# Patient Record
Sex: Female | Born: 1995
Health system: Southern US, Community
[De-identification: ages and names within clinical notes are randomized; demographics above are authoritative.]

## PROBLEM LIST (undated history)

## (undated) DIAGNOSIS — H9192 Unspecified hearing loss, left ear: Secondary | ICD-10-CM

## (undated) DIAGNOSIS — K5010 Crohn's disease of large intestine without complications: Secondary | ICD-10-CM

## (undated) DIAGNOSIS — J45909 Unspecified asthma, uncomplicated: Secondary | ICD-10-CM

---

## 2017-07-02 ENCOUNTER — Encounter (HOSPITAL_COMMUNITY): Payer: Self-pay | Admitting: *Deleted

## 2017-07-02 ENCOUNTER — Inpatient Hospital Stay (HOSPITAL_COMMUNITY)
Admission: EM | Admit: 2017-07-02 | Discharge: 2017-07-04 | DRG: 387 | Disposition: A | Discharge status: Home / Self Care | Payer: Self-pay | Attending: Internal Medicine | Admitting: Internal Medicine

## 2017-07-02 ENCOUNTER — Emergency Department (HOSPITAL_COMMUNITY): Payer: Self-pay

## 2017-07-02 DIAGNOSIS — K50811 Crohn's disease of both small and large intestine with rectal bleeding: Principal | ICD-10-CM | POA: Diagnosis present

## 2017-07-02 DIAGNOSIS — K5010 Crohn's disease of large intestine without complications: Secondary | ICD-10-CM

## 2017-07-02 DIAGNOSIS — J45909 Unspecified asthma, uncomplicated: Secondary | ICD-10-CM | POA: Diagnosis present

## 2017-07-02 DIAGNOSIS — F17210 Nicotine dependence, cigarettes, uncomplicated: Secondary | ICD-10-CM | POA: Diagnosis present

## 2017-07-02 DIAGNOSIS — K529 Noninfective gastroenteritis and colitis, unspecified: Secondary | ICD-10-CM

## 2017-07-02 DIAGNOSIS — D649 Anemia, unspecified: Secondary | ICD-10-CM

## 2017-07-02 DIAGNOSIS — K50911 Crohn's disease, unspecified, with rectal bleeding: Secondary | ICD-10-CM

## 2017-07-02 DIAGNOSIS — K5090 Crohn's disease, unspecified, without complications: Secondary | ICD-10-CM | POA: Diagnosis present

## 2017-07-02 DIAGNOSIS — K50011 Crohn's disease of small intestine with rectal bleeding: Secondary | ICD-10-CM

## 2017-07-02 DIAGNOSIS — D638 Anemia in other chronic diseases classified elsewhere: Secondary | ICD-10-CM | POA: Diagnosis present

## 2017-07-02 DIAGNOSIS — E876 Hypokalemia: Secondary | ICD-10-CM | POA: Diagnosis present

## 2017-07-02 HISTORY — DX: Unspecified asthma, uncomplicated: J45.909

## 2017-07-02 HISTORY — DX: Crohn's disease of large intestine without complications: K50.10

## 2017-07-02 HISTORY — DX: Unspecified hearing loss, left ear: H91.92

## 2017-07-02 LAB — CBC
HCT: 34.3 % — ABNORMAL LOW (ref 36.0–46.0)
Hemoglobin: 10.9 g/dL — ABNORMAL LOW (ref 12.0–15.0)
MCH: 27.5 pg (ref 26.0–34.0)
MCHC: 31.8 g/dL (ref 30.0–36.0)
MCV: 86.6 fL (ref 78.0–100.0)
PLATELETS: 414 10*3/uL — AB (ref 150–400)
RBC: 3.96 MIL/uL (ref 3.87–5.11)
RDW: 13.4 % (ref 11.5–15.5)
WBC: 12.4 10*3/uL — ABNORMAL HIGH (ref 4.0–10.5)

## 2017-07-02 LAB — COMPREHENSIVE METABOLIC PANEL
ALBUMIN: 3.4 g/dL — AB (ref 3.5–5.0)
ALT: 8 U/L — AB (ref 14–54)
AST: 14 U/L — AB (ref 15–41)
Alkaline Phosphatase: 61 U/L (ref 38–126)
Anion gap: 8 (ref 5–15)
BUN: 6 mg/dL (ref 6–20)
CALCIUM: 9 mg/dL (ref 8.9–10.3)
CHLORIDE: 107 mmol/L (ref 101–111)
CO2: 23 mmol/L (ref 22–32)
CREATININE: 0.74 mg/dL (ref 0.44–1.00)
GFR calc non Af Amer: >60 mL/min (ref >60)
GLUCOSE: 81 mg/dL (ref 65–99)
Potassium: 3.3 mmol/L — ABNORMAL LOW (ref 3.5–5.1)
Sodium: 138 mmol/L (ref 135–145)
Total Bilirubin: 0.3 mg/dL (ref 0.3–1.2)
Total Protein: 7 g/dL (ref 6.5–8.1)

## 2017-07-02 LAB — DIFFERENTIAL
BASOS ABS: 0 10*3/uL (ref 0.0–0.1)
BASOS PCT: 0 %
EOS ABS: 0.5 10*3/uL (ref 0.0–0.7)
Eosinophils Relative: 4 %
LYMPHS ABS: 2.5 10*3/uL (ref 0.7–4.0)
Lymphocytes Relative: 20 %
MONO ABS: 1.1 10*3/uL — AB (ref 0.1–1.0)
MONOS PCT: 9 %
Neutro Abs: 8.3 10*3/uL — ABNORMAL HIGH (ref 1.7–7.7)
Neutrophils Relative %: 67 %

## 2017-07-02 LAB — I-STAT BETA HCG BLOOD, ED (MC, WL, AP ONLY)

## 2017-07-02 LAB — LIPASE, BLOOD: LIPASE: 29 U/L (ref 11–51)

## 2017-07-02 MED ORDER — ALBUTEROL SULFATE HFA 108 (90 BASE) MCG/ACT IN AERS: 2.0000 | INHALATION_SPRAY | Freq: Four times a day (QID) | RESPIRATORY_TRACT | Status: DC | PRN

## 2017-07-02 MED ORDER — ALBUTEROL SULFATE (2.5 MG/3ML) 0.083% IN NEBU: 2.5000 mg | INHALATION_SOLUTION | Freq: Four times a day (QID) | RESPIRATORY_TRACT | Status: DC | PRN

## 2017-07-02 MED ORDER — ONDANSETRON HCL 4 MG PO TABS: 4.0000 mg | ORAL_TABLET | Freq: Four times a day (QID) | ORAL | Status: DC | PRN

## 2017-07-02 MED ORDER — ACETAMINOPHEN 325 MG PO TABS: 650.0000 mg | ORAL_TABLET | Freq: Four times a day (QID) | ORAL | Status: DC | PRN

## 2017-07-02 MED ORDER — METHYLPREDNISOLONE SODIUM SUCC 125 MG IJ SOLR
60.0000 mg | Freq: Two times a day (BID) | INTRAMUSCULAR | Status: DC
  Administered 2017-07-03 – 2017-07-04 (×3): 60 mg via INTRAVENOUS
  Filled 2017-07-02 (×3): qty 2

## 2017-07-02 MED ORDER — NICOTINE 21 MG/24HR TD PT24
21.0000 mg | MEDICATED_PATCH | Freq: Once | TRANSDERMAL | Status: DC
  Administered 2017-07-02: 21 mg via TRANSDERMAL
  Filled 2017-07-02: qty 1

## 2017-07-02 MED ORDER — IOPAMIDOL (ISOVUE-300) INJECTION 61%
INTRAVENOUS | Status: AC
  Filled 2017-07-02: qty 30

## 2017-07-02 MED ORDER — MORPHINE SULFATE (PF) 4 MG/ML IV SOLN
4.0000 mg | Freq: Once | INTRAVENOUS | Status: AC
  Administered 2017-07-02: 4 mg via INTRAVENOUS
  Filled 2017-07-02: qty 1

## 2017-07-02 MED ORDER — METHYLPREDNISOLONE SODIUM SUCC 125 MG IJ SOLR
125.0000 mg | Freq: Once | INTRAMUSCULAR | Status: AC
  Administered 2017-07-02: 125 mg via INTRAVENOUS
  Filled 2017-07-02: qty 2

## 2017-07-02 MED ORDER — POTASSIUM CHLORIDE IN NACL 20-0.9 MEQ/L-% IV SOLN
INTRAVENOUS | Status: DC
  Filled 2017-07-02: qty 1000

## 2017-07-02 MED ORDER — MORPHINE SULFATE (PF) 2 MG/ML IV SOLN
2.0000 mg | INTRAVENOUS | Status: DC | PRN
  Administered 2017-07-02 – 2017-07-03 (×5): 2 mg via INTRAVENOUS
  Filled 2017-07-02 (×5): qty 1

## 2017-07-02 MED ORDER — SODIUM CHLORIDE 0.9 % IV BOLUS (SEPSIS)
1000.0000 mL | Freq: Once | INTRAVENOUS | Status: AC
  Administered 2017-07-02: 1000 mL via INTRAVENOUS

## 2017-07-02 MED ORDER — ONDANSETRON HCL 4 MG/2ML IJ SOLN
4.0000 mg | Freq: Once | INTRAMUSCULAR | Status: AC
Start: 2017-07-02 — End: 2017-07-02
  Administered 2017-07-02: 4 mg via INTRAVENOUS
  Filled 2017-07-02: qty 2

## 2017-07-02 MED ORDER — ONDANSETRON HCL 4 MG/2ML IJ SOLN: 4.0000 mg | Freq: Four times a day (QID) | INTRAMUSCULAR | Status: DC | PRN

## 2017-07-02 MED ORDER — POTASSIUM CHLORIDE 2 MEQ/ML IV SOLN
INTRAVENOUS | Status: DC
  Administered 2017-07-02: 21:00:00 via INTRAVENOUS
  Filled 2017-07-02 (×3): qty 1000

## 2017-07-02 MED ORDER — IOPAMIDOL (ISOVUE-300) INJECTION 61%
INTRAVENOUS | Status: AC
  Administered 2017-07-02: 100 mL
  Filled 2017-07-02: qty 100

## 2017-07-02 NOTE — ED Provider Notes (Signed)
[facility]-EMERGENCY DEPT Provider Note   CSN: [id number] Arrival date & time: 07/02/17  9562     History   Chief Complaint Chief Complaint  Patient presents with  . Abdominal Pain  . Diarrhea    HPI Stacy Wiggins is a 21 y.o. female.  HPI  21 year old female Who presents with abdominal pain. History of Crohn's colitis and no prior history of abdominal pain. Says that she has been lost to GI follow-up for about a year now. Reports 2 months of gradually worsening lower abdominal pain with bloody diarrhea. No fevers or chills but having night sweats. Has had some weight loss due to decreased appetite and limited ability to tolerate by mouth intake. No dysuria, urinary frequency, abnormal vaginal bleeding or vaginal discharge. Currently is on her menses.  Past Medical History:  Diagnosis Date  . Asthma   . Crohn's colitis (HCC)   . Hearing impaired person, left     There are no active problems to display for this patient.   History reviewed. No pertinent surgical history.  OB History    No data available       Home Medications    Prior to Admission medications   Medication Sig Start Date End Date Taking? Authorizing Provider  acetaminophen (TYLENOL) 500 MG tablet Take 1,000 mg by mouth every 6 (six) hours as needed for mild pain.   Yes [provider]  diphenhydramine-acetaminophen (TYLENOL PM) 25-500 MG TABS tablet Take 1 tablet by mouth at bedtime as needed (pain or sleep).   Yes [provider]    Family History No family history on file.  Social History Social History  Substance Use Topics  . Smoking status: Current Every Day Smoker    Packs/day: 0.50    Types: Cigarettes  . Smokeless tobacco: Never Used  . Alcohol use No     Allergies   Patient has no known allergies.   Review of Systems Review of Systems  Constitutional: Negative for fever.  Gastrointestinal: Positive for abdominal pain, blood in stool and diarrhea.  Genitourinary:  Negative for dysuria.  Hematological: Does not bruise/bleed easily.  Psychiatric/Behavioral: Negative for confusion.  All other systems reviewed and are negative.    Physical Exam Updated Vital Signs BP 102/63   Pulse 79   Temp 97.9 F (36.6 C) (Oral)   Resp 16   Ht 5\' 3"  (1.6 m)   Wt 46.7 kg (103 lb)   LMP 06/30/2017   SpO2 100%   BMI 18.25 kg/m   Physical Exam Physical Exam  Nursing note and vitals reviewed. Constitutional: Well developed, well nourished, non-toxic, and in no acute distress Head: Normocephalic and atraumatic.  Mouth/Throat: Oropharynx is clear and moist.  Neck: Normal range of motion. Neck supple.  Cardiovascular: Normal rate and regular rhythm.   Pulmonary/Chest: Effort normal and breath sounds normal.  Abdominal: Soft. There is bilateral low abdominal tenderness. There is no rebound and no guarding.  Musculoskeletal: Normal range of motion.  Neurological: Alert, no facial droop, fluent speech, moves all extremities symmetrically Skin: Skin is warm and dry.  Psychiatric: Cooperative   ED Treatments / Results  Labs (all labs ordered are listed, but only abnormal results are displayed) Labs Reviewed  COMPREHENSIVE METABOLIC PANEL - Abnormal; Notable for the following:       Result Value   Potassium 3.3 (*)    Albumin 3.4 (*)    AST 14 (*)    ALT 8 (*)    All other components  within normal limits  CBC - Abnormal; Notable for the following:    WBC 12.4 (*)    Hemoglobin 10.9 (*)    HCT 34.3 (*)    Platelets 414 (*)    All other components within normal limits  DIFFERENTIAL - Abnormal; Notable for the following:    Neutro Abs 8.3 (*)    Monocytes Absolute 1.1 (*)    All other components within normal limits  LIPASE, BLOOD  I-STAT BETA HCG BLOOD, ED ([facility], [facility], [facility] ONLY)    EKG  EKG Interpretation None       Radiology Ct Abdomen Pelvis W Contrast  Result Date: 07/02/2017 CLINICAL DATA:  Lower abdominal pain.  History of Crohn's  disease. EXAM: CT ABDOMEN AND PELVIS WITH CONTRAST TECHNIQUE: Multidetector CT imaging of the abdomen and pelvis was performed using the standard protocol following bolus administration of intravenous contrast. CONTRAST:  ISOVUE-300 IOPAMIDOL (ISOVUE-300) INJECTION 61% COMPARISON:  None. FINDINGS: Lower chest: The lung bases are clear of acute process. No pleural effusion or pulmonary lesions. The heart is normal in size. No pericardial effusion. The distal esophagus and aorta are unremarkable. Hepatobiliary: No focal hepatic lesions or intrahepatic biliary dilatation. The gallbladder is normal. No common bile duct dilatation. Pancreas: No mass, inflammation or ductal dilatation. Spleen: Normal size.  No focal lesions. Adrenals/Urinary Tract: Adrenal glands, kidneys and bladder are unremarkable. Stomach/Bowel: Diffuse colitis with moderate wall thickening and pericolonic interstitial/inflammatory changes. There is also involvement of the terminal ileum. The appendix also appears mildly inflamed but I do not see any findings of obstructive appendicitis. No obstructive findings. Vascular/Lymphatic: The aorta is normal in caliber. No dissection. The branch vessels are patent. The major venous structures are patent. No mesenteric or retroperitoneal mass or adenopathy. Scattered inflamed/ hyperplastic lymph nodes mainly in the pericecal region. Reproductive: The uterus and ovaries are unremarkable. Other: No pelvic mass or adenopathy. No free pelvic fluid collections. No inguinal mass or adenopathy. No abdominal wall hernia or subcutaneous lesions. Musculoskeletal: No significant bony findings. IMPRESSION: 1. Terminal ileitis and diffuse colitis suggesting active Crohn's disease. There is also mild diffuse inflammation of the appendix. No obstructive findings. Associated enlarged/inflamed/hyperplastic lymph nodes. 2. No other significant abdominal/pelvic findings. Electronically Signed   By: Rudie Meyer M.D.    On: 07/02/2017 15:10    Procedures Procedures (including critical care time)  Medications Ordered in ED Medications  iopamidol (ISOVUE-300) 61 % injection (not administered)  nicotine (NICODERM CQ - dosed in mg/24 hours) patch 21 mg (21 mg Transdermal Patch Applied 07/02/17 1143)  sodium chloride 0.9 % bolus 1,000 mL (0 mLs Intravenous Stopped 07/02/17 1331)  ondansetron (ZOFRAN) injection 4 mg (4 mg Intravenous Given 07/02/17 1127)  morphine 4 MG/ML injection 4 mg (4 mg Intravenous Given 07/02/17 1128)  iopamidol (ISOVUE-300) 61 % injection (100 mLs  Contrast Given 07/02/17 1432)  morphine 4 MG/ML injection 4 mg (4 mg Intravenous Given 07/02/17 1510)  methylPREDNISolone sodium succinate (SOLU-MEDROL) 125 mg/2 mL injection 125 mg (125 mg Intravenous Given 07/02/17 1537)     Initial Impression / Assessment and Plan / ED Course  I have reviewed the triage vital signs and the nursing notes.  Pertinent labs & imaging results that were available during my care of the patient were reviewed by me and considered in my medical decision making (see chart for details).     21 year old female with history of Crohn's colitis who presents with gradually worsening abdominal pain over the past 2 months. Records reviewed. Patient  last seen by gastroenterologist a year ago. Previously on budesonide, but currently not taking.   Diffusely tender abdomen. CT abd/pelvis visualized and shows pancolitis with terminal ileitis. Started on solumedrol. Concerned as patient without any outpatient follow-up and without insurance. Plan to admit for crohn's colitis.   Plan to consult GI. Discussed with Dr. Cheryl Flash, who will see patient tomorrow in consultation.   Discussed with Dr. Sharl Ma who will admit to hospitalist service.  Final Clinical Impressions(s) / ED Diagnoses   Final diagnoses:  Crohn's disease of colon without complication (HCC)  Colitis    New Prescriptions New Prescriptions   No medications on file      Lavera Guise, MD 07/02/17 [phone]

## 2017-07-02 NOTE — Progress Notes (Signed)
Received report on pt.

## 2017-07-02 NOTE — ED Notes (Signed)
Pt given oral contrast and verbalized understanding of drink times.

## 2017-07-02 NOTE — H&P (Addendum)
[facility] H&P    Patient Demographics:    Stacy Wiggins, is a 21 y.o. female  MRN: [id number]  DOB - [birth year]  Admit Date - 07/02/2017  Referring MD/NP/PA: Dr Verdie Mosher  Outpatient Primary MD for the patient is Patient, No Pcp Per  Patient coming from: Home  Chief Complaint  Patient presents with  . Abdominal Pain  . Diarrhea      HPI:    Stacy Wiggins  is a 21 y.o. female, With history of Crohn disease, asthma came to [facility] pain, diarrhea for past 2 weeks. Patient says that 2 months ago she started having gradually worsening abdominal pain and also having bloody diarrhea which became worse over the past few weeks. Pain relieved by heating pad, hot  water baths. Pain aggravated by eating food. She also continues with nausea and intermittent vomiting. Comparison night sweats. No fever. No headache. No dysuria. No chest shortness of breath. Patient has history of Crohn disease and was followed by gastroenterologist at [facility]. She stopped seeing the gastroenterologist a year ago, and has no medications for Crohn disease. Patient has no health insurance.  ED physician called and discussed with GI Dr Bosie Clos, will see patient in a.m.  Patient received Solu-Medrol 125 mg IV in the ED.     Review of systems:     All other systems reviewed and are negative.   With Past History of the following :    Past Medical History:  Diagnosis Date  . Asthma   . Crohn's colitis (HCC)   . Hearing impaired person, left          Social History:      Social History  Substance Use Topics  . Smoking status: Current Every Day Smoker    Packs/day: 0.50    Types: Cigarettes  . Smokeless tobacco: Never Used  . Alcohol use No       Family History :   No pertinent family history   Home Medications:   Prior to Admission medications   Medication Sig Start Date End Date Taking? Authorizing Provider    acetaminophen (TYLENOL) 500 MG tablet Take 1,000 mg by mouth every 6 (six) hours as needed for mild pain.   Yes [provider]  diphenhydramine-acetaminophen (TYLENOL PM) 25-500 MG TABS tablet Take 1 tablet by mouth at bedtime as needed (pain or sleep).   Yes [provider]     Allergies:    No Known Allergies   Physical Exam:   Vitals  Blood pressure 102/63, pulse 79, temperature 97.9 F (36.6 C), temperature source Oral, resp. rate 16, height 5\' 3"  (1.6 m), weight 46.7 kg (103 lb), last menstrual period 06/30/2017, SpO2 100 %.  1.  General: Appears in no acute distress  2. Psychiatric:  Intact judgement and  insight, awake alert, oriented x 3.  3. Neurologic: No focal neurological deficits, all cranial nerves intact.Strength 5/5 all 4 extremities, sensation intact all 4 extremities, plantars down going.  4. Eyes :  anicteric sclerae, moist conjunctivae with no lid lag.  PERRLA.  5. ENMT:  Oropharynx clear with moist mucous membranes and good dentition  6. Neck:  supple, no cervical lymphadenopathy appriciated, No thyromegaly  7. Respiratory : Normal respiratory effort, good air movement bilaterally,clear to  auscultation bilaterally  8. Cardiovascular : RRR, no gallops, rubs or murmurs, no leg edema  9. Gastrointestinal:  Positive bowel sounds, abdomen soft, tender to palpation in epigastric region, no hepatosplenomegaly, no rigidity or guarding       10. Skin:  No cyanosis, normal texture and turgor, no rash, lesions or ulcers  11.Musculoskeletal:  Good muscle tone,  joints appear normal , no effusions,  normal range of motion    Data Review:    CBC  Recent Labs Lab 07/02/17 1050  WBC 12.4*  HGB 10.9*  HCT 34.3*  PLT 414*  MCV 86.6  MCH 27.5  MCHC 31.8  RDW 13.4  LYMPHSABS 2.5  MONOABS 1.1*  EOSABS 0.5  BASOSABS 0.0    ------------------------------------------------------------------------------------------------------------------  Chemistries   Recent Labs Lab 07/02/17 1050  NA 138  K 3.3*  CL 107  CO2 23  GLUCOSE 81  BUN 6  CREATININE 0.74  CALCIUM 9.0  AST 14*  ALT 8*  ALKPHOS 61  BILITOT 0.3   ------------------------------------------------------------------------------------------------------------------  ------------------------------------------------------------------------------------------------------------------ GFR: Estimated Creatinine Clearance: 81.3 mL/min (by C-G formula based on SCr of 0.74 mg/dL). Liver Function Tests:  Recent Labs Lab 07/02/17 1050  AST 14*  ALT 8*  ALKPHOS 61  BILITOT 0.3  PROT 7.0  ALBUMIN 3.4*    Recent Labs Lab 07/02/17 1050  LIPASE 29   No results for input(s): AMMONIA in the last 168 hours. Coagulation Profile: No results for input(s): INR, PROTIME in the last 168 hours. Cardiac Enzymes: No results for input(s): CKTOTAL, CKMB, CKMBINDEX, TROPONINI in the last 168 hours. BNP (last 3 results) No results for input(s): PROBNP in the last 8760 hours. HbA1C: No results for input(s): HGBA1C in the last 72 hours. CBG: No results for input(s): GLUCAP in the last 168 hours. Lipid Profile: No results for input(s): CHOL, HDL, LDLCALC, TRIG, CHOLHDL, LDLDIRECT in the last 72 hours. Thyroid Function Tests: No results for input(s): TSH, T4TOTAL, FREET4, T3FREE, THYROIDAB in the last 72 hours. Anemia Panel: No results for input(s): VITAMINB12, FOLATE, FERRITIN, TIBC, IRON, RETICCTPCT in the last 72 hours.  --------------------------------------------------------------------------------------------------------------- Urine analysis: No results found for: COLORURINE, APPEARANCEUR, LABSPEC, PHURINE, GLUCOSEU, HGBUR, BILIRUBINUR, KETONESUR, PROTEINUR, UROBILINOGEN, NITRITE, LEUKOCYTESUR    Imaging Results:    Ct Abdomen Pelvis W  Contrast  Result Date: 07/02/2017 CLINICAL DATA:  Lower abdominal pain.  History of Crohn's disease. EXAM: CT ABDOMEN AND PELVIS WITH CONTRAST TECHNIQUE: Multidetector CT imaging of the abdomen and pelvis was performed using the standard protocol following bolus administration of intravenous contrast. CONTRAST:  ISOVUE-300 IOPAMIDOL (ISOVUE-300) INJECTION 61% COMPARISON:  None. FINDINGS: Lower chest: The lung bases are clear of acute process. No pleural effusion or pulmonary lesions. The heart is normal in size. No pericardial effusion. The distal esophagus and aorta are unremarkable. Hepatobiliary: No focal hepatic lesions or intrahepatic biliary dilatation. The gallbladder is normal. No common bile duct dilatation. Pancreas: No mass, inflammation or ductal dilatation. Spleen: Normal size.  No focal lesions. Adrenals/Urinary Tract: Adrenal glands, kidneys and bladder are unremarkable. Stomach/Bowel: Diffuse colitis with moderate wall thickening and pericolonic interstitial/inflammatory changes. There is also involvement of the terminal ileum. The appendix also appears mildly inflamed but I do not see any findings of obstructive appendicitis. No obstructive findings. Vascular/Lymphatic: The aorta  is normal in caliber. No dissection. The branch vessels are patent. The major venous structures are patent. No mesenteric or retroperitoneal mass or adenopathy. Scattered inflamed/ hyperplastic lymph nodes mainly in the pericecal region. Reproductive: The uterus and ovaries are unremarkable. Other: No pelvic mass or adenopathy. No free pelvic fluid collections. No inguinal mass or adenopathy. No abdominal wall hernia or subcutaneous lesions. Musculoskeletal: No significant bony findings. IMPRESSION: 1. Terminal ileitis and diffuse colitis suggesting active Crohn's disease. There is also mild diffuse inflammation of the appendix. No obstructive findings. Associated enlarged/inflamed/hyperplastic lymph nodes. 2. No  other significant abdominal/pelvic findings. Electronically Signed   By: Rudie Meyer M.D.   On: 07/02/2017 15:10       Assessment & Plan:    Active Problems:   Crohn's disease (HCC)   1. Crohn's colitis- CT scan showed terminal ileitis with diffuse colitis suggesting active Crohn disease, also has mild inflammation of appendix. We'll continue with Solu-Medrol 60 mg IV every 12 hrs. GI has been consulted. Will keep her NPO. Morphine when necessary for pain. 2. Hypokalemia-patient started on IV D5 normal saline +20 mg KCl. Follow BMP in AM. 3. Asthma-stable, continue when necessary albuterol 4. Tobacco abuse- counseled to quit smoking.    DVT Prophylaxis-   SCDs   AM Labs Ordered, also please review Full Orders  Family Communication: no family at bedside  Code Status:  Full code  Admission status: Observation    Time spent in minutes : 60 minutes   Elye Harmsen S M.D on 07/02/2017 at 4:50 PM  Between 7am to 7pm - Pager - [phone]. After 7pm go to [email or web address] - password [facility]  [facility] - Office  [phone]

## 2017-07-02 NOTE — ED Triage Notes (Signed)
Pt states has been dealing with crohns flare up x 2 months.  States pain is becoming intolerable and is having diarrhea q hour.

## 2017-07-02 NOTE — ED Notes (Signed)
Pt ambulatory to and from restroom with independent gait

## 2017-07-02 NOTE — Progress Notes (Signed)
Stacy Wiggins is a 21 y.o. female patient admitted from ED awake, alert - oriented  X 4 - no acute distress noted.  VSS - Blood pressure 114/73, pulse 77, temperature 97.9 F (36.6 C), temperature source Oral, resp. rate 16, height 5\' 3"  (1.6 m), weight 46.7 kg (103 lb), last menstrual period 06/30/2017, SpO2 98 %.    IV in place, occlusive dsg intact without redness.  Orientation to room, and floor completed with information packet given to patient/family.  Patient declined safety video at this time.  Admission INP armband ID verified with patient/family, and in place.   SR up x 2, fall assessment complete, with patient and family able to verbalize understanding of risk associated with falls, and verbalized understanding to call nsg before up out of bed.  Call light within reach, patient able to voice, and demonstrate understanding.  Skin, clean-dry- intact without evidence of bruising, or skin tears. Pt has a bug bit on top of right foot.  No evidence of skin break down noted on exam.     Will cont to eval and treat per MD orders.  Theodosia Blender, RN 07/02/2017 5:49 PM

## 2017-07-03 DIAGNOSIS — K50911 Crohn's disease, unspecified, with rectal bleeding: Secondary | ICD-10-CM

## 2017-07-03 DIAGNOSIS — J45909 Unspecified asthma, uncomplicated: Secondary | ICD-10-CM

## 2017-07-03 DIAGNOSIS — K5090 Crohn's disease, unspecified, without complications: Secondary | ICD-10-CM | POA: Diagnosis present

## 2017-07-03 DIAGNOSIS — D649 Anemia, unspecified: Secondary | ICD-10-CM

## 2017-07-03 LAB — RETICULOCYTES
RBC.: 3.62 MIL/uL — AB (ref 3.87–5.11)
RETIC COUNT ABSOLUTE: 50.7 10*3/uL (ref 19.0–186.0)
Retic Ct Pct: 1.4 % (ref 0.4–3.1)

## 2017-07-03 LAB — CBC
HEMATOCRIT: 31.3 % — AB (ref 36.0–46.0)
Hemoglobin: 10.2 g/dL — ABNORMAL LOW (ref 12.0–15.0)
MCH: 28.3 pg (ref 26.0–34.0)
MCHC: 32.6 g/dL (ref 30.0–36.0)
MCV: 86.7 fL (ref 78.0–100.0)
PLATELETS: 364 10*3/uL (ref 150–400)
RBC: 3.61 MIL/uL — ABNORMAL LOW (ref 3.87–5.11)
RDW: 13.7 % (ref 11.5–15.5)
WBC: 13.5 10*3/uL — AB (ref 4.0–10.5)

## 2017-07-03 LAB — COMPREHENSIVE METABOLIC PANEL
ALBUMIN: 2.8 g/dL — AB (ref 3.5–5.0)
ALT: 7 U/L — ABNORMAL LOW (ref 14–54)
ANION GAP: 7 (ref 5–15)
AST: 9 U/L — ABNORMAL LOW (ref 15–41)
Alkaline Phosphatase: 52 U/L (ref 38–126)
BILIRUBIN TOTAL: 0.4 mg/dL (ref 0.3–1.2)
BUN: <5 mg/dL — ABNORMAL LOW (ref 6–20)
CHLORIDE: 107 mmol/L (ref 101–111)
CO2: 22 mmol/L (ref 22–32)
Calcium: 8.5 mg/dL — ABNORMAL LOW (ref 8.9–10.3)
Creatinine, Ser: 0.53 mg/dL (ref 0.44–1.00)
GFR calc Af Amer: >60 mL/min (ref >60)
GLUCOSE: 139 mg/dL — AB (ref 65–99)
POTASSIUM: 4.3 mmol/L (ref 3.5–5.1)
Sodium: 136 mmol/L (ref 135–145)
TOTAL PROTEIN: 5.9 g/dL — AB (ref 6.5–8.1)

## 2017-07-03 LAB — FERRITIN: FERRITIN: 56 ng/mL (ref 11–307)

## 2017-07-03 MED ORDER — SODIUM CHLORIDE 0.9 % IV SOLN
INTRAVENOUS | Status: DC
  Administered 2017-07-03: 06:00:00 via INTRAVENOUS
  Filled 2017-07-03 (×2): qty 1000

## 2017-07-03 MED ORDER — MORPHINE SULFATE (PF) 2 MG/ML IV SOLN
2.0000 mg | INTRAVENOUS | Status: DC | PRN
  Administered 2017-07-03 – 2017-07-04 (×5): 2 mg via INTRAVENOUS
  Filled 2017-07-03 (×5): qty 1

## 2017-07-03 MED ORDER — OXYCODONE HCL 5 MG PO TABS
5.0000 mg | ORAL_TABLET | ORAL | Status: DC | PRN
  Administered 2017-07-03 – 2017-07-04 (×6): 5 mg via ORAL
  Filled 2017-07-03 (×6): qty 1

## 2017-07-03 MED ORDER — SODIUM CHLORIDE 0.9 % IV SOLN
INTRAVENOUS | Status: DC
  Administered 2017-07-03 – 2017-07-04 (×3): via INTRAVENOUS

## 2017-07-03 MED ORDER — NICOTINE 14 MG/24HR TD PT24
14.0000 mg | MEDICATED_PATCH | Freq: Every day | TRANSDERMAL | Status: DC
  Administered 2017-07-03 – 2017-07-04 (×2): 14 mg via TRANSDERMAL
  Filled 2017-07-03 (×2): qty 1

## 2017-07-03 NOTE — Care Management Note (Addendum)
Case Management Note  Patient Details  Name: Temari Whinnery MRN: [id number] Date of Birth: 07/23/1996  Subjective/Objective:           Presents with abdominal pain/ Crohn's disease, history of Crohn disease, asthma. Resides in [place] with family. Pt states was visiting fiance' who currently lives in [place]  prior to hospital visit. Independent with ADL's and no DME usage PTA.    Armandina Stammer Benkelman         Action/Plan: Pt without health insurance, no job. States will f/u with [facility] Department for asisstance  with medication/ and establishing PCP.  [facility] information explained and provided to pt just in case visit in [place] is prolonged once d/c.  Pt could potentially need Match Letter @ d/c to assist with Rx cost. CM explained Match Letter to pt and pt stated she will need assistance with pain med Rx only because if she's written for ABX  she will not  take it  2/2 to little food intake, however, CM explained to pt Match Letter will not cover narcotics.   Expected Discharge Date:                  Expected Discharge Plan:  Home/Self Care  In-House Referral:     Discharge planning Services  CM Consult  Post Acute Care Choice:    Choice offered to:     DME Arranged:    DME Agency:     HH Arranged:    HH Agency:     Status of Service:  Completed, signed off  If discussed at [employer] of Stay Meetings, dates discussed:    Additional Comments:  Epifanio Lesches, RN 07/03/2017, 12:39 PM

## 2017-07-03 NOTE — Progress Notes (Signed)
PROGRESS NOTE    Stacy Wiggins   ZDG:[id number]  DOB: [birth year]  DOA: 07/02/2017 PCP: Patient, No Pcp Per   Brief Narrative:  Stacy Wiggins is a 21 y/o female with Chron's disease found on colonoscopy last year. Underwent initially treatment at [facility] and was placed on Mesalamine (per notes) and was going to be started on Humira. She did not have insurance and never returned.  She presents for abdominal pain and diarrhea which has been bloody at times for at least 2 months now. She has also had nausea and vomiting. Pain worse with eating.    Subjective: Angry about being NPO. Had not had a stool all night until one small one this AM. Has soreness in lower abdomen. No nausea and no vomiting.  ROS: no complaints of  cough, dyspnea or dysuria. No other complaints.   Assessment & Plan:   Principal Problem:   Acute Crohn's disease with rectal bleeding - cont IV steroids- clear liquids today- GI will evaluate her - pain control - IVF - strict I and O  Active Problems:    Anemia - check anemia panel in AM  Nicotine abuse - Nicoderm patch ordered  Asthma in adult without complication - stable- cont Albuterol PRN   DVT prophylaxis: SCDs Code Status: Full code Family Communication:  Disposition Plan: follow on med surg Consultants:   GI Procedures:   none Antimicrobials:  Anti-infectives    None       Objective: Vitals:   07/02/17 1820 07/02/17 2108 07/03/17 0445 07/03/17 1359  BP: (!) 106/58 (!) 105/55 (!) 93/46 (!) 100/58  Pulse: 89 85 67 98  Resp: 14 17 16 18   Temp: 98.4 F (36.9 C) 98.1 F (36.7 C) 98.1 F (36.7 C) 98.1 F (36.7 C)  TempSrc: Oral Oral Oral Oral  SpO2: 98% 98% 98% 100%  Weight:      Height:        Intake/Output Summary (Last 24 hours) at 07/03/17 1444 Last data filed at 07/03/17 1400  Gross per 24 hour  Intake           1582.5 ml  Output              500 ml  Net           1082.5 ml   Filed Weights   07/02/17 1020    Weight: 46.7 kg (103 lb)    Examination: General exam: Appears comfortable  HEENT: PERRLA, oral mucosa moist, no sclera icterus or thrush Respiratory system: Clear to auscultation. Respiratory effort normal. Cardiovascular system: S1 & S2 heard, RRR.  No murmurs  Gastrointestinal system: Abdomen soft, tenderness across lower abdomen, nondistended. Normal bowel sound. No organomegaly Central nervous system: Alert and oriented. No focal neurological deficits. Extremities: No cyanosis, clubbing or edema Skin: No rashes or ulcers Psychiatry:  Mood & affect appropriate.     Data Reviewed: I have personally reviewed following labs and imaging studies  CBC:  Recent Labs Lab 07/02/17 1050 07/03/17 0510  WBC 12.4* 13.5*  NEUTROABS 8.3*  --   HGB 10.9* 10.2*  HCT 34.3* 31.3*  MCV 86.6 86.7  PLT 414* 364   Basic Metabolic Panel:  Recent Labs Lab 07/02/17 1050 07/03/17 0510  NA 138 136  K 3.3* 4.3  CL 107 107  CO2 23 22  GLUCOSE 81 139*  BUN 6 <5*  CREATININE 0.74 0.53  CALCIUM 9.0 8.5*   GFR: Estimated Creatinine Clearance: 81.3 mL/min (by C-G formula based  on SCr of 0.53 mg/dL). Liver Function Tests:  Recent Labs Lab 07/02/17 1050 07/03/17 0510  AST 14* 9*  ALT 8* 7*  ALKPHOS 61 52  BILITOT 0.3 0.4  PROT 7.0 5.9*  ALBUMIN 3.4* 2.8*    Recent Labs Lab 07/02/17 1050  LIPASE 29   No results for input(s): AMMONIA in the last 168 hours. Coagulation Profile: No results for input(s): INR, PROTIME in the last 168 hours. Cardiac Enzymes: No results for input(s): CKTOTAL, CKMB, CKMBINDEX, TROPONINI in the last 168 hours. BNP (last 3 results) No results for input(s): PROBNP in the last 8760 hours. HbA1C: No results for input(s): HGBA1C in the last 72 hours. CBG: No results for input(s): GLUCAP in the last 168 hours. Lipid Profile: No results for input(s): CHOL, HDL, LDLCALC, TRIG, CHOLHDL, LDLDIRECT in the last 72 hours. Thyroid Function Tests: No results  for input(s): TSH, T4TOTAL, FREET4, T3FREE, THYROIDAB in the last 72 hours. Anemia Panel: No results for input(s): VITAMINB12, FOLATE, FERRITIN, TIBC, IRON, RETICCTPCT in the last 72 hours. Urine analysis: No results found for: COLORURINE, APPEARANCEUR, LABSPEC, PHURINE, GLUCOSEU, HGBUR, BILIRUBINUR, KETONESUR, PROTEINUR, UROBILINOGEN, NITRITE, LEUKOCYTESUR Sepsis Labs: @LABRCNTIP (procalcitonin:4,lacticidven:4) )No results found for this or any previous visit (from the past 240 hour(s)).       Radiology Studies: Ct Abdomen Pelvis W Contrast  Result Date: 07/02/2017 CLINICAL DATA:  Lower abdominal pain.  History of Crohn's disease. EXAM: CT ABDOMEN AND PELVIS WITH CONTRAST TECHNIQUE: Multidetector CT imaging of the abdomen and pelvis was performed using the standard protocol following bolus administration of intravenous contrast. CONTRAST:  ISOVUE-300 IOPAMIDOL (ISOVUE-300) INJECTION 61% COMPARISON:  None. FINDINGS: Lower chest: The lung bases are clear of acute process. No pleural effusion or pulmonary lesions. The heart is normal in size. No pericardial effusion. The distal esophagus and aorta are unremarkable. Hepatobiliary: No focal hepatic lesions or intrahepatic biliary dilatation. The gallbladder is normal. No common bile duct dilatation. Pancreas: No mass, inflammation or ductal dilatation. Spleen: Normal size.  No focal lesions. Adrenals/Urinary Tract: Adrenal glands, kidneys and bladder are unremarkable. Stomach/Bowel: Diffuse colitis with moderate wall thickening and pericolonic interstitial/inflammatory changes. There is also involvement of the terminal ileum. The appendix also appears mildly inflamed but I do not see any findings of obstructive appendicitis. No obstructive findings. Vascular/Lymphatic: The aorta is normal in caliber. No dissection. The branch vessels are patent. The major venous structures are patent. No mesenteric or retroperitoneal mass or adenopathy. Scattered  inflamed/ hyperplastic lymph nodes mainly in the pericecal region. Reproductive: The uterus and ovaries are unremarkable. Other: No pelvic mass or adenopathy. No free pelvic fluid collections. No inguinal mass or adenopathy. No abdominal wall hernia or subcutaneous lesions. Musculoskeletal: No significant bony findings. IMPRESSION: 1. Terminal ileitis and diffuse colitis suggesting active Crohn's disease. There is also mild diffuse inflammation of the appendix. No obstructive findings. Associated enlarged/inflamed/hyperplastic lymph nodes. 2. No other significant abdominal/pelvic findings. Electronically Signed   By: Rudie Meyer M.D.   On: 07/02/2017 15:10      Scheduled Meds: . methylPREDNISolone (SOLU-MEDROL) injection  60 mg Intravenous Q12H  . nicotine  14 mg Transdermal Daily   Continuous Infusions: . sodium chloride 0.9 % 1,000 mL infusion 125 mL/hr at 07/03/17 0628     LOS: 0 days    Time spent in minutes: 35    Calvert Cantor, MD [facility] Pager: [email or web address] Password [facility] 07/03/2017, 2:44 PM

## 2017-07-03 NOTE — Consult Note (Signed)
Date: 07/03/2017               Patient Name:  Tanijah Morais MRN: [id number]  DOB: Aug 13, 1996 Age / Sex: 21 y.o., female   PCP: Patient, No Pcp Per         Requesting Physician: Dr. Calvert Cantor, MD    Consulting Reason:  Abdominal pain with history of Crohn's disease     History of Present Illness: Ms. Booton is a 21yo female with PMH significant for Crohn's (diagnosed in 09/2016) and asthma. GI was consulted for lower abdominal pain.  Patient reports 2 months of worsening lower abdominal pain. Pain is worse with food. Has had bloody diarrhea. Has >10 BMs/day. Sometimes wakes up at night with diarrhea. Denies fever or chills, although she does have night sweats. Endorses weight loss of ~7 lbs over the last few weeks because of decreased appetite and inability to tolerate oral intake due to pain. Prior to 2 months ago, she still had diarrhea, although it was nonbloody and had 2-3 loose stools/day. Has had several ulcers in her mouth. Denies skin changes, joint pains, or eye changes.  She was diagnosed with Crohn's in Oct 2017. Colonoscopy in 09/2016 with streaky ulceration in linear and patchy distribution from proximal descending colon to cecum. Ileocecal valve appeared swollen. Biopsies were performed. She was prescribed budesonide. However, patient does not have insurance and could not afford the medication, so she has not taken budesonide. She has not followed up with her gastroenterologist since then.  She smokes ~1/2ppd for 10 years, denies alcohol use or other illicit drugs.  Meds: Current Facility-Administered Medications  Medication Dose Route Frequency Provider Last Rate Last Dose  . acetaminophen (TYLENOL) tablet 650 mg  650 mg Oral Q6H PRN Meredeth Ide, MD      . albuterol (PROVENTIL) (2.5 MG/3ML) 0.083% nebulizer solution 2.5 mg  2.5 mg Nebulization Q6H PRN Meredeth Ide, MD      . methylPREDNISolone sodium succinate (SOLU-MEDROL) 125 mg/2 mL injection 60 mg  60 mg Intravenous Q12H  Meredeth Ide, MD   60 mg at 07/03/17 0552  . morphine 2 MG/ML injection 2 mg  2 mg Intravenous Q4H PRN Meredeth Ide, MD   2 mg at 07/03/17 1054  . nicotine (NICODERM CQ - dosed in mg/24 hours) patch 14 mg  14 mg Transdermal Daily Calvert Cantor, MD   14 mg at 07/03/17 1054  . ondansetron (ZOFRAN) tablet 4 mg  4 mg Oral Q6H PRN Meredeth Ide, MD       Or  . ondansetron (ZOFRAN) injection 4 mg  4 mg Intravenous Q6H PRN Sharl Ma, Sarina Ill, MD      . sodium chloride 0.9 % 1,000 mL infusion   Intravenous Continuous Meredeth Ide, MD 125 mL/hr at 07/03/17 7829     Allergies: Allergies as of 07/02/2017  . (No Known Allergies)   Past Medical History:  Diagnosis Date  . Asthma   . Crohn's colitis (HCC)   . Hearing impaired person, left    History reviewed. No pertinent surgical history.  Family History Crohn's and ?colon cancer in great grandmother  Social History   Social History  . Marital status: Single    Spouse name: N/A  . Number of children: N/A  . Years of education: N/A   Occupational History  . Not on file.   Social History Main Topics  . Smoking status: Current Every Day Smoker    Packs/day: 0.50  Types: Cigarettes  . Smokeless tobacco: Never Used  . Alcohol use No  . Drug use: No  . Sexual activity: Not on file   Other Topics Concern  . Not on file   Social History Narrative  . No narrative on file   Review of Systems: Constitutional: Negative for diaphoresis, fever, malaise/fatigue. Positive for weight loss and night sweats.  HEENT: Negative for blurred vision, sinus pain, congestion, and sore throat. Positive for oral ulcers. Respiratory: Negative for cough, shortness of breath and wheezing. Cardiovascular: Negative for chest pain, palpitations, orthopnea, PND, and leg swelling. Gastrointestinal: As stated in HPI. Genitourinary: Negative for dysuria and hematuria.  Musculoskeletal: Negative for joint pain and myalgias.  Neurological: Negative for dizziness,  focal weakness, weakness and headaches.  Physical Exam: Blood pressure (!) 93/46, pulse 67, temperature 98.1 F (36.7 C), temperature source Oral, resp. rate 16, height 5\' 3"  (1.6 m), weight 103 lb (46.7 kg), last menstrual period 06/30/2017, SpO2 98 %. GEN: Well-appearing. Alert and oriented. No acute distress. HENT: Moist mucous membranes. Not able to visualize oral lesions. EYES: EOMI. Sclera anicteric. RESP: Clear to auscultation bilaterally. No wheezes, rales, or rhonchi. CV: Normal rate and regular rhythm. No murmurs, gallops, or rubs. No LE edema. ABD: Soft. Tender to palpation in lower abdomen. Non-distended. Normoactive bowel sounds. EXT: No clubbing, cyanosis, or edema. Warm and well perfused. Has a painful 2mm circular dark scab lesion on L dorsal foot that has persisted for several weeks (patient thinks it is due to a biting fly) NEURO: Cranial nerves II-XII grossly intact. Able to lift all four extremities against gravity.  Lab results:  Recent Labs Lab 07/02/17 1050 07/03/17 0510  BUN 6 <5*  CREATININE 0.74 0.53  NA 138 136  K 3.3* 4.3  CO2 23 22  CL 107 107  AST 14* 9*  ALT 8* 7*  PROT 7.0 5.9*    Recent Labs Lab 07/02/17 1050 07/03/17 0510  WBC 12.4* 13.5*  HGB 10.9* 10.2*  HCT 34.3* 31.3*  PLT 414* 364  MCV 86.6 86.7  MCH 27.5 28.3  MCHC 31.8 32.6  RDW 13.4 13.7  MONOABS 1.1*  --   EOSABS 0.5  --    tprot 5.9, alb 2.8, AST 9, ALT 7, AP 52, tbili 0.4 Lipase normal  Imaging results:  Ct Abdomen Pelvis W Contrast  Result Date: 07/02/2017 CLINICAL DATA:  Lower abdominal pain.  History of Crohn's disease. EXAM: CT ABDOMEN AND PELVIS WITH CONTRAST TECHNIQUE: Multidetector CT imaging of the abdomen and pelvis was performed using the standard protocol following bolus administration of intravenous contrast. CONTRAST:  ISOVUE-300 IOPAMIDOL (ISOVUE-300) INJECTION 61% COMPARISON:  None. FINDINGS: Lower chest: The lung bases are clear of acute process. No  pleural effusion or pulmonary lesions. The heart is normal in size. No pericardial effusion. The distal esophagus and aorta are unremarkable. Hepatobiliary: No focal hepatic lesions or intrahepatic biliary dilatation. The gallbladder is normal. No common bile duct dilatation. Pancreas: No mass, inflammation or ductal dilatation. Spleen: Normal size.  No focal lesions. Adrenals/Urinary Tract: Adrenal glands, kidneys and bladder are unremarkable. Stomach/Bowel: Diffuse colitis with moderate wall thickening and pericolonic interstitial/inflammatory changes. There is also involvement of the terminal ileum. The appendix also appears mildly inflamed but I do not see any findings of obstructive appendicitis. No obstructive findings. Vascular/Lymphatic: The aorta is normal in caliber. No dissection. The branch vessels are patent. The major venous structures are patent. No mesenteric or retroperitoneal mass or adenopathy. Scattered inflamed/ hyperplastic lymph  nodes mainly in the pericecal region. Reproductive: The uterus and ovaries are unremarkable. Other: No pelvic mass or adenopathy. No free pelvic fluid collections. No inguinal mass or adenopathy. No abdominal wall hernia or subcutaneous lesions. Musculoskeletal: No significant bony findings. IMPRESSION: 1. Terminal ileitis and diffuse colitis suggesting active Crohn's disease. There is also mild diffuse inflammation of the appendix. No obstructive findings. Associated enlarged/inflamed/hyperplastic lymph nodes. 2. No other significant abdominal/pelvic findings. Electronically Signed   By: Rudie Meyer M.D.   On: 07/02/2017 15:10   Assessment, Plan, & Recommendations by Problem: Active Problems:   Crohn's disease (HCC) Lower abdominal pain with bloody diarrhea Likely a Crohn's flare. Has never been on medication for Crohn's due to lack of insurance. Risks of untreated Crohn's, including fistulas, were discussed with patient. Patient is not staying in [place]  long-term but was urged to establish care with a gastroenterologist and to fill out paperwork for Humira again. Patient is receiving IV solu-medrol 60mg  BID here. She feels hungry. Can start soft diet as tolerated. If abdominal pain or emesis worsens, return to full liquid diet. - Soft diet. Advance diet as tolerated - Continue IV solu-medrol 60mg  BID - If tolerating diet and symptoms improve, can discharge home tomorrow with oral prednisone taper  Signed: Scherrie Gerlach, MD Internal Medicine, PGY-1 07/03/2017, 1:41 PM     ADDENDUM: She was diagnosed with Crohn's disease last year, has not been able to be on medications on a regular basis because of insurance issues. Advised the patient, that steroids should only be used for induction of remission and not long-term especially in a young patient as it is known to cause multiple side effects. Okay to advance her diet. Patient needs to be discharged on tapering steroids PO daily, 60 mg for a week, 50 mg for a week, 40 mg for a week, 30 mg per week, 20 mg for a week, then 10 mg daily for a week and then discontinue. She may benefit from use of biologic such as Humira.  Roxana Hires [phone]

## 2017-07-04 DIAGNOSIS — J4520 Mild intermittent asthma, uncomplicated: Secondary | ICD-10-CM

## 2017-07-04 DIAGNOSIS — K50911 Crohn's disease, unspecified, with rectal bleeding: Secondary | ICD-10-CM

## 2017-07-04 LAB — BASIC METABOLIC PANEL
Anion gap: 7 (ref 5–15)
BUN: 9 mg/dL (ref 6–20)
CHLORIDE: 107 mmol/L (ref 101–111)
CO2: 24 mmol/L (ref 22–32)
Calcium: 8.8 mg/dL — ABNORMAL LOW (ref 8.9–10.3)
Creatinine, Ser: 0.6 mg/dL (ref 0.44–1.00)
GFR calc Af Amer: >60 mL/min (ref >60)
GFR calc non Af Amer: >60 mL/min (ref >60)
Glucose, Bld: 118 mg/dL — ABNORMAL HIGH (ref 65–99)
POTASSIUM: 4.8 mmol/L (ref 3.5–5.1)
Sodium: 138 mmol/L (ref 135–145)

## 2017-07-04 LAB — IRON AND TIBC
IRON: 59 ug/dL (ref 28–170)
Saturation Ratios: 26 % (ref 10.4–31.8)
TIBC: 227 ug/dL — AB (ref 250–450)
UIBC: 168 ug/dL

## 2017-07-04 LAB — CBC
HEMATOCRIT: 31.3 % — AB (ref 36.0–46.0)
Hemoglobin: 9.9 g/dL — ABNORMAL LOW (ref 12.0–15.0)
MCH: 28 pg (ref 26.0–34.0)
MCHC: 31.6 g/dL (ref 30.0–36.0)
MCV: 88.4 fL (ref 78.0–100.0)
Platelets: 367 10*3/uL (ref 150–400)
RBC: 3.54 MIL/uL — ABNORMAL LOW (ref 3.87–5.11)
RDW: 14 % (ref 11.5–15.5)
WBC: 11.2 10*3/uL — ABNORMAL HIGH (ref 4.0–10.5)

## 2017-07-04 LAB — FOLATE: Folate: 16.2 ng/mL (ref >5.9)

## 2017-07-04 LAB — HIV ANTIBODY (ROUTINE TESTING W REFLEX): HIV SCREEN 4TH GENERATION: NONREACTIVE

## 2017-07-04 LAB — VITAMIN B12: Vitamin B-12: 649 pg/mL (ref 180–914)

## 2017-07-04 MED ORDER — OXYCODONE HCL 5 MG PO TABS: 5.0000 mg | ORAL_TABLET | ORAL | 0 refills | Status: AC | PRN

## 2017-07-04 MED ORDER — PREDNISONE 10 MG PO TABS: 60.0000 mg | ORAL_TABLET | Freq: Every day | ORAL | 0 refills | Status: AC

## 2017-07-04 MED FILL — predniSONE 10 MG TABS: 10 | 42 days supply | Qty: 147 | Fill #0

## 2017-07-04 MED FILL — oxyCODONE HCL 5 MG TABS: 5 | 2 days supply | Qty: 10 | Fill #0

## 2017-07-04 NOTE — Care Management Note (Signed)
Case Management Note  Patient Details  Name: Yuleidy Rappleye MRN: [id number] Date of Birth: 08-Nov-1996  Subjective/Objective:  Pt presented for Acute Chron's Disease. Pt lives in [place]- No insurance and no PCP. [facility]. Pt's boyfriend to provide transportation to home.                   Action/Plan: CM did call the [facility] to get cash price for prednisone tapered dose- per pharmacy will be $9.00. Oxycodone price to be $17.00 for 30 tablets. Per pt her boyfriend will be able to pay for the cost of medications. No further needs from CM @ this time.    Expected Discharge Date:  07/04/17               Expected Discharge Plan:  Home/Self Care  In-House Referral:  NA  Discharge planning Services  CM Consult  Post Acute Care Choice:  NA Choice offered to:  NA  DME Arranged:  N/A DME Agency:  NA  HH Arranged:  NA HH Agency:  NA  Status of Service:  Completed, signed off  If discussed at Long Length of Stay Meetings, dates discussed:    Additional Comments:  Gala Lewandowsky, RN 07/04/2017, 1:21 PM

## 2017-07-04 NOTE — Plan of Care (Signed)
Problem: Safety: Goal: Ability to remain free from injury will improve Outcome: Progressing Pt will be free from falls and injuries during this hospitalization.  Problem: Pain Managment: Goal: General experience of comfort will improve Outcome: Progressing Pt will be goal tolerable pain level prior to discharge.

## 2017-07-04 NOTE — Discharge Summary (Signed)
Physician Discharge Summary  Stacy Wiggins ZOX:[id number] DOB: 1996/07/18 DOA: 07/02/2017  PCP: Patient, No Pcp Per  Admit date: 07/02/2017 Discharge date: 07/04/2017  Recommendations for Outpatient Follow-up:  Patient needs to be discharged on tapering steroids PO daily, 60 mg for a week, 50 mg for a week, 40 mg for a week, 30 mg per week, 20 mg for a week, then 10 mg daily for a week and then discontinue. Follow up with GI in 2-4 weeks to make sure symptoms are stable.  Discharge Diagnoses:  Principal Problem:   Acute Crohn's disease with rectal bleeding (HCC) Active Problems:   Asthma in adult without complication   Anemia   Crohn's disease, acute (HCC)    Discharge Condition: stable; pt insisting on going home today, has tolerated regular diet  Diet recommendation: as tolerated   History of present illness:   Per brief narrative 7/17 "Stacy Wiggins is a 21 y/o female with Chron's disease found on colonoscopy last year. Underwent initially treatment at [facility] and was placed on Mesalamine (per notes) and was going to be started on Humira. She did not have insurance and never returned.  She presents for abdominal pain and diarrhea which has been bloody at times for at least 2 months now. She has also had nausea and vomiting. Pain worse with eating. "  Hospital Course:   Principal Problem:   Acute Crohn's disease with rectal bleeding (HCC) - Per GI, Patient needs to be discharged on tapering steroids PO daily, 60 mg for a week, 50 mg for a week, 40 mg for a week, 30 mg per week, 20 mg for a week, then 10 mg daily for a week and then discontinue. - Pt wants to go home today  Active Problems:   Asthma in adult without complication - Stable resp status     Anemia of chronic disease - Hgb stable    Nicotine abuse - Nicotine patch ordered in hospital    Signed:  Manson Passey, MD  [facility] 07/04/2017, 11:49 AM  Pager #: [phone]  Time spent in minutes:  more than 30 minutes  Procedures:  None   Consultations:  GI  Discharge Exam: Vitals:   07/03/17 2212 07/04/17 0614  BP: (!) 97/59 (!) 101/53  Pulse: 85 64  Resp: 18 18  Temp: 97.7 F (36.5 C) 97.8 F (36.6 C)   Vitals:   07/03/17 0445 07/03/17 1359 07/03/17 2212 07/04/17 0614  BP: (!) 93/46 (!) 100/58 (!) 97/59 (!) 101/53  Pulse: 67 98 85 64  Resp: 16 18 18 18   Temp: 98.1 F (36.7 C) 98.1 F (36.7 C) 97.7 F (36.5 C) 97.8 F (36.6 C)  TempSrc: Oral Oral Oral Oral  SpO2: 98% 100% 99% 99%  Weight:      Height:        General: Pt is alert, follows commands appropriately, not in acute distress Cardiovascular: Regular rate and rhythm, S1/S2 +, no murmurs Respiratory: Clear to auscultation bilaterally, no wheezing, no crackles, no rhonchi Abdominal: Soft, non tender, non distended, bowel sounds +, no guarding Extremities: no edema, no cyanosis, pulses palpable bilaterally DP and PT Neuro: Grossly nonfocal  Discharge Instructions  Discharge Instructions    Call MD for:  difficulty breathing, headache or visual disturbances    Complete by:  As directed    Call MD for:  persistant nausea and vomiting    Complete by:  As directed    Call MD for:  redness, tenderness, or signs  of infection (pain, swelling, redness, odor or green/yellow discharge around incision site)    Complete by:  As directed    Call MD for:  severe uncontrolled pain    Complete by:  As directed    Diet - low sodium heart healthy    Complete by:  As directed    Discharge instructions    Complete by:  As directed    Patient needs to be discharged on tapering steroids PO daily, 60 mg for a week, 50 mg for a week, 40 mg for a week, 30 mg per week, 20 mg for a week, then 10 mg daily for a week and then discontinue. Follow up with GI in 2-4 weeks to make sure symptoms are stable.   Increase activity slowly    Complete by:  As directed      Allergies as of 07/04/2017   No Known Allergies      Medication List    TAKE these medications   acetaminophen 500 MG tablet Commonly known as:  TYLENOL Take 1,000 mg by mouth every 6 (six) hours as needed for mild pain.   diphenhydramine-acetaminophen 25-500 MG Tabs tablet Commonly known as:  TYLENOL PM Take 1 tablet by mouth at bedtime as needed (pain or sleep).   oxyCODONE 5 MG immediate release tablet Commonly known as:  Oxy IR/ROXICODONE Take 1 tablet (5 mg total) by mouth every 4 (four) hours as needed for moderate pain.   predniSONE 10 MG tablet Commonly known as:  DELTASONE Take 6 tablets (60 mg total) by mouth daily. Patient needs to be discharged on tapering steroids PO daily, 60 mg for a week, 50 mg for a week, 40 mg for a week, 30 mg per week, 20 mg for a week, then 10 mg daily for a week and then discontinue.         The results of significant diagnostics from this hospitalization (including imaging, microbiology, ancillary and laboratory) are listed below for reference.    Significant Diagnostic Studies: Ct Abdomen Pelvis W Contrast  Result Date: 07/02/2017 CLINICAL DATA:  Lower abdominal pain.  History of Crohn's disease. EXAM: CT ABDOMEN AND PELVIS WITH CONTRAST TECHNIQUE: Multidetector CT imaging of the abdomen and pelvis was performed using the standard protocol following bolus administration of intravenous contrast. CONTRAST:  ISOVUE-300 IOPAMIDOL (ISOVUE-300) INJECTION 61% COMPARISON:  None. FINDINGS: Lower chest: The lung bases are clear of acute process. No pleural effusion or pulmonary lesions. The heart is normal in size. No pericardial effusion. The distal esophagus and aorta are unremarkable. Hepatobiliary: No focal hepatic lesions or intrahepatic biliary dilatation. The gallbladder is normal. No common bile duct dilatation. Pancreas: No mass, inflammation or ductal dilatation. Spleen: Normal size.  No focal lesions. Adrenals/Urinary Tract: Adrenal glands, kidneys and bladder are unremarkable.  Stomach/Bowel: Diffuse colitis with moderate wall thickening and pericolonic interstitial/inflammatory changes. There is also involvement of the terminal ileum. The appendix also appears mildly inflamed but I do not see any findings of obstructive appendicitis. No obstructive findings. Vascular/Lymphatic: The aorta is normal in caliber. No dissection. The branch vessels are patent. The major venous structures are patent. No mesenteric or retroperitoneal mass or adenopathy. Scattered inflamed/ hyperplastic lymph nodes mainly in the pericecal region. Reproductive: The uterus and ovaries are unremarkable. Other: No pelvic mass or adenopathy. No free pelvic fluid collections. No inguinal mass or adenopathy. No abdominal wall hernia or subcutaneous lesions. Musculoskeletal: No significant bony findings. IMPRESSION: 1. Terminal ileitis and diffuse colitis suggesting active Crohn's  disease. There is also mild diffuse inflammation of the appendix. No obstructive findings. Associated enlarged/inflamed/hyperplastic lymph nodes. 2. No other significant abdominal/pelvic findings. Electronically Signed   By: Rudie Meyer M.D.   On: 07/02/2017 15:10    Microbiology: No results found for this or any previous visit (from the past 240 hour(s)).   Labs: Basic Metabolic Panel:  Recent Labs Lab 07/02/17 1050 07/03/17 0510 07/04/17 0319  NA 138 136 138  K 3.3* 4.3 4.8  CL 107 107 107  CO2 23 22 24   GLUCOSE 81 139* 118*  BUN 6 <5* 9  CREATININE 0.74 0.53 0.60  CALCIUM 9.0 8.5* 8.8*   Liver Function Tests:  Recent Labs Lab 07/02/17 1050 07/03/17 0510  AST 14* 9*  ALT 8* 7*  ALKPHOS 61 52  BILITOT 0.3 0.4  PROT 7.0 5.9*  ALBUMIN 3.4* 2.8*    Recent Labs Lab 07/02/17 1050  LIPASE 29   No results for input(s): AMMONIA in the last 168 hours. CBC:  Recent Labs Lab 07/02/17 1050 07/03/17 0510 07/04/17 0319  WBC 12.4* 13.5* 11.2*  NEUTROABS 8.3*  --   --   HGB 10.9* 10.2* 9.9*  HCT 34.3*  31.3* 31.3*  MCV 86.6 86.7 88.4  PLT 414* 364 367   Cardiac Enzymes: No results for input(s): CKTOTAL, CKMB, CKMBINDEX, TROPONINI in the last 168 hours. BNP: BNP (last 3 results) No results for input(s): BNP in the last 8760 hours.  ProBNP (last 3 results) No results for input(s): PROBNP in the last 8760 hours.  CBG: No results for input(s): GLUCAP in the last 168 hours.

## 2017-07-04 NOTE — Discharge Instructions (Signed)
Crohn Disease Crohn disease is a long-lasting (chronic) disease that affects your gastrointestinal (GI) tract. It often causes irritation and swelling (inflammation) in your small intestine and the beginning of your large intestine. However, it can affect any part of your GI tract. Crohn disease is part of a group of illnesses that are known as inflammatory bowel disease (IBD). Crohn disease may start slowly and get worse over time. Symptoms may come and go. They may also disappear for months or even years at a time (remission). What are the causes? The exact cause of Crohn disease is not known. It may be a response that causes your body's defense system (immune system) to mistakenly attack healthy cells and tissues (autoimmune response). Your genes and your environment may also play a role. What increases the risk? You may be at greater risk for Crohn disease if you:  Have other family members with Crohn disease or another IBD.  Use any tobacco products, including cigarettes, chewing tobacco, or electronic cigarettes.  Are in your 20s.  Have Eastern European ancestry.  What are the signs or symptoms? The main signs and symptoms of Crohn disease involve your GI tract. These include:  Diarrhea.  Rectal bleeding.  An urgent need to move your bowels.  The feeling that you are not finished having a bowel movement.  Abdominal pain or cramping.  Constipation.  General signs and symptoms of Crohn disease may also include:  Unexplained weight loss.  Fatigue.  Fever.  Nausea.  Loss of appetite.  Joint pain  Changes in vision.  Red bumps on your skin.  How is this diagnosed? Your health care provider may suspect Crohn disease based on your symptoms and your medical history. Your health care provider will do a physical exam. You may need to see a health care provider who specializes in diseases of the digestive tract (gastroenterologist). You may also have tests to help your  health care providers make a diagnosis. These may include:  Blood tests.  Stool sample tests.  Imaging tests, such as X-rays and CT scans.  Tests to examine the inside of your intestines using a long, flexible tube that has a light and a camera on the end (endoscopy or colonoscopy).  A procedure to take tissue samples from inside your bowel (biopsy) to be examined under a microscope.  How is this treated? There is no cure for Crohn disease. Treatment will focus on managing your symptoms. Crohn disease affects each person differently. Your treatment may include:  Resting your bowels. Drinking only clear liquids or getting nutrition through an IV for a period of time gives your bowels a chance to heal because they are not passing stools.  Medicines. These may be used alone or in combination (combination therapy). These may include antibiotic medicines. You may be given medicines that help to: ? Reduce inflammation. ? Control your immune system activity. ? Fight infections. ? Relieve cramps and prevent diarrhea. ? Control your pain.  Surgery. You may need surgery if: ? Medicines and other treatments are no longer working. ? You develop complications from severe Crohn disease. ? A section of your intestine becomes so damaged that it needs to be removed.  Follow these instructions at home:  Take medicines only as directed by your health care provider.  If you were prescribed an antibiotic medicine, finish it all even if you start to feel better.  Keep all follow-up visits as directed by your health care provider. This is important.  Talk with your   health care provider about changing your diet. This may help your symptoms. Your health care provide may recommend changes, such as: ? Drinking more fluids. ? Avoiding milk and other foods that contain lactose. ? Eating a low-fat diet. ? Avoiding high-fiber foods, such as popcorn and nuts. ? Avoiding carbonated beverages, such as  soda. ? Eating smaller meals more often rather than eating large meals. ? Keeping a food diary to identify foods that make your symptoms better or worse.  Do not use any tobacco products, including cigarettes, chewing tobacco, or electronic cigarettes. If you need help quitting, ask your health care provider.  Limit alcohol intake to no more than 1 drink per day for nonpregnant women and 2 drinks per day for men. One drink equals 12 ounces of beer, 5 ounces of wine, or 1 ounces of hard liquor.  Exercise daily or as directed by your health care provider. Contact a health care provider if:  You have diarrhea, abdominal cramps, and other gastrointestinal problems that are present almost all of the time.  Your symptoms do not improve with treatment.  You continue to lose weight.  You develop a rash or sores on your skin.  You develop eye problems.  You have a fever.  Your symptoms get worse.  You develop new symptoms. Get help right away if:  You have bloody diarrhea.  You develop severe abdominal pain.  You cannot pass stools. This information is not intended to replace advice given to you by your health care provider. Make sure you discuss any questions you have with your health care provider. Document Released: 09/13/2005 Document Revised: 04/13/2016 Document Reviewed: 07/22/2014 Elsevier Interactive Patient Education  2018 [employer]  

## 2017-07-08 LAB — HIGH SENSITIVITY CRP: CRP HIGH SENSITIVITY: 8.05 mg/L — AB (ref 0.00–3.00)

## 2018-02-06 IMAGING — CT CT ABD-PELV W/ CM
2 of 4 series · 16 of 46 positions shown, 18 images · IV contrast (APPLIED)
Comparison: None.

CLINICAL DATA: Lower abdominal pain.  History of Crohn's disease.

EXAM:
CT ABDOMEN AND PELVIS WITH CONTRAST
TECHNIQUE: Multidetector CT imaging of the abdomen and pelvis was performed
using the standard protocol following bolus administration of
intravenous contrast.
CONTRAST:  100mL RQY53R-HUU IOPAMIDOL (RQY53R-HUU) INJECTION 61%

[Series 3: abdomen 5.0 · axial · 0.63mm/px · z∈[+765,+1120]mm · 13 of 81 slices shown, 15 images]
[im 5/81  soft-tissue]
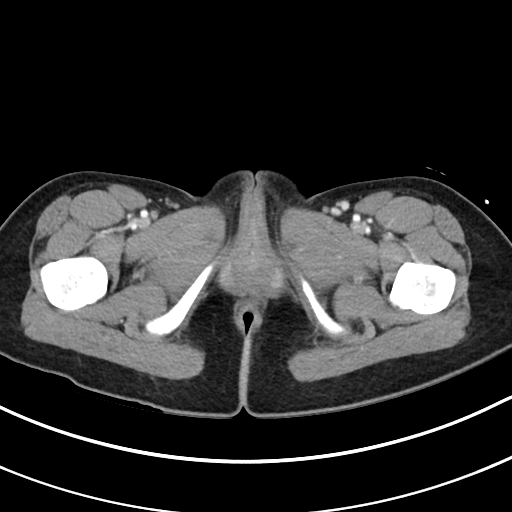
[im 5/81  bone]
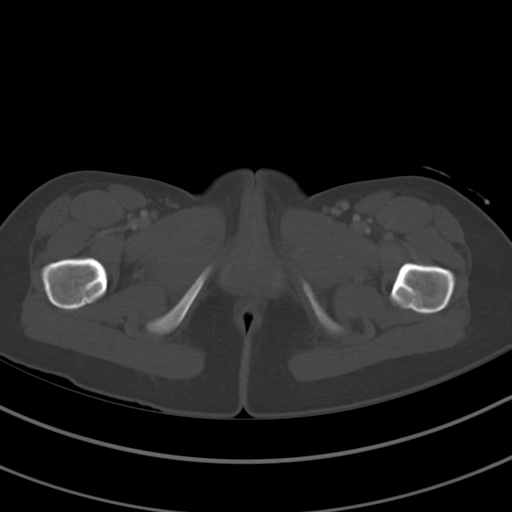
[im 9/81  soft-tissue]
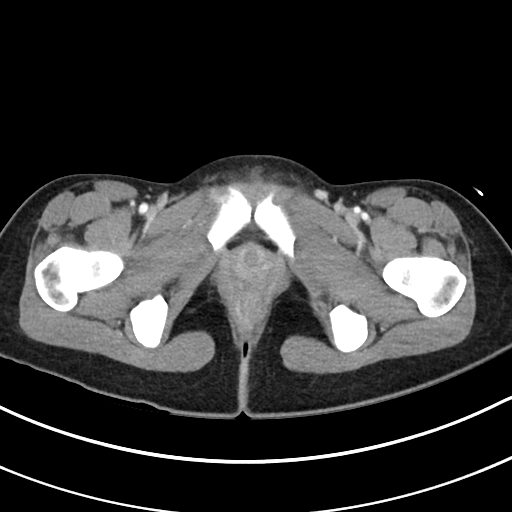
[im 18/81  soft-tissue]
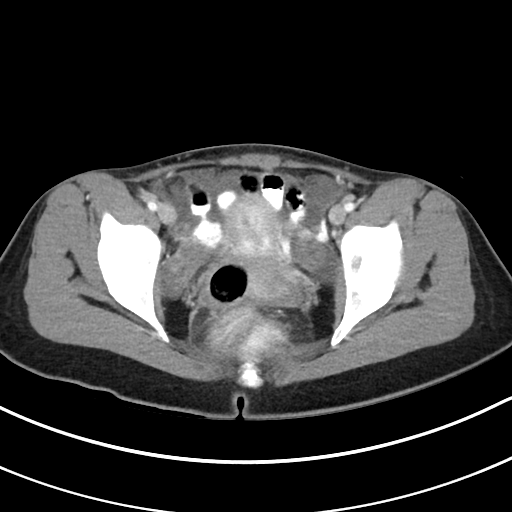
[im 23/81  soft-tissue]
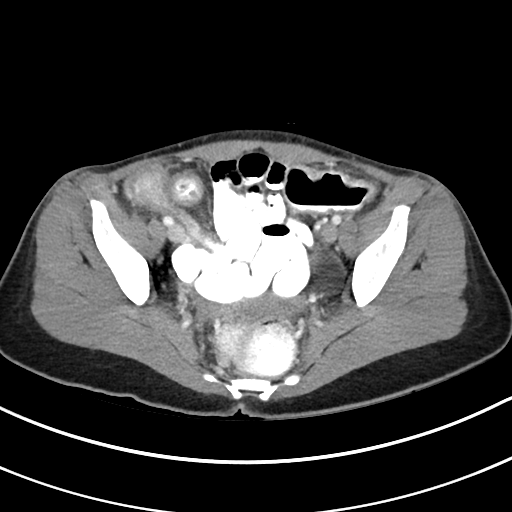
[im 27/81  soft-tissue]
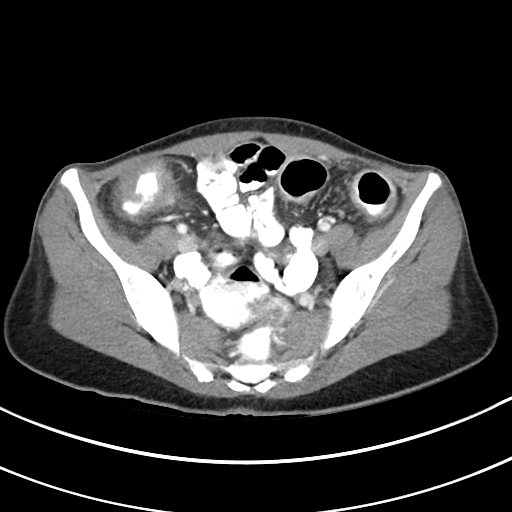
[im 36/81  soft-tissue]
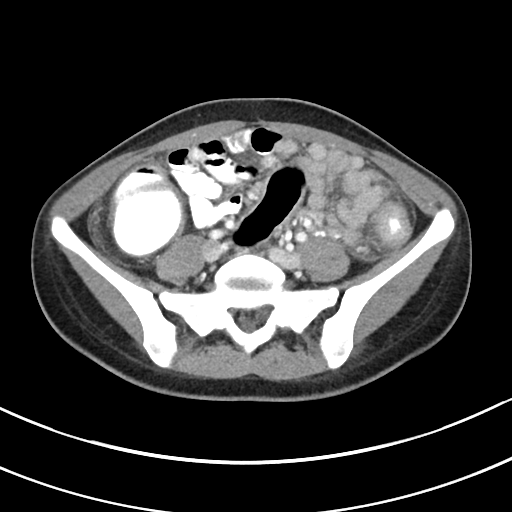
[im 41/81  soft-tissue]
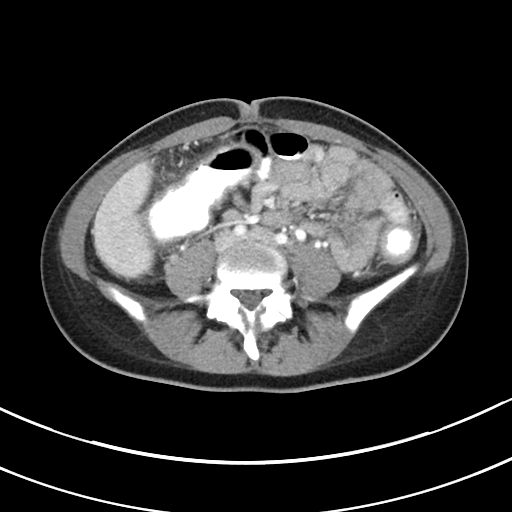
[im 45/81  soft-tissue]
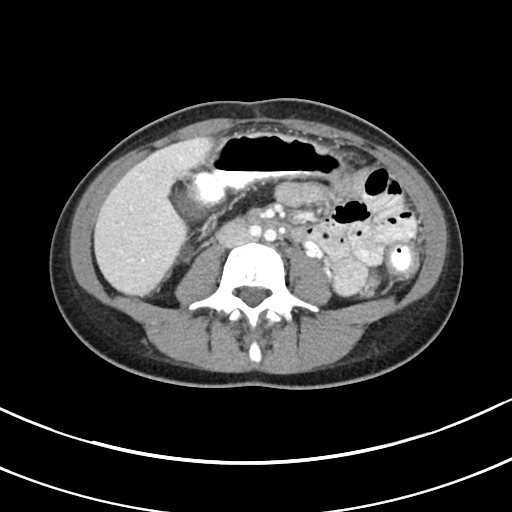
[im 54/81  soft-tissue]
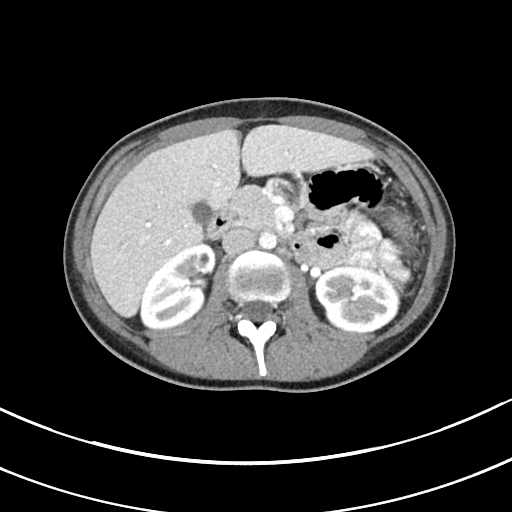
[im 54/81  bone]
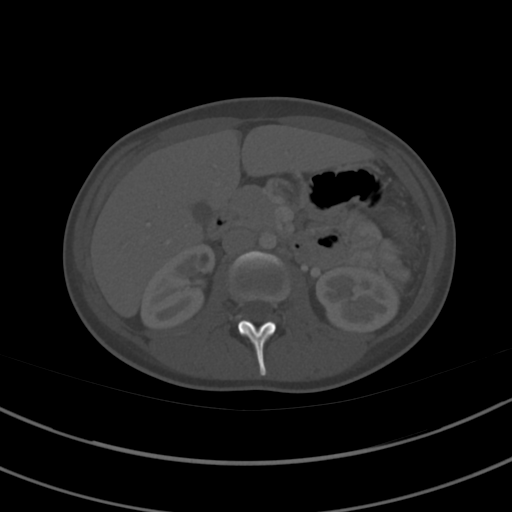
[im 58/81  soft-tissue]
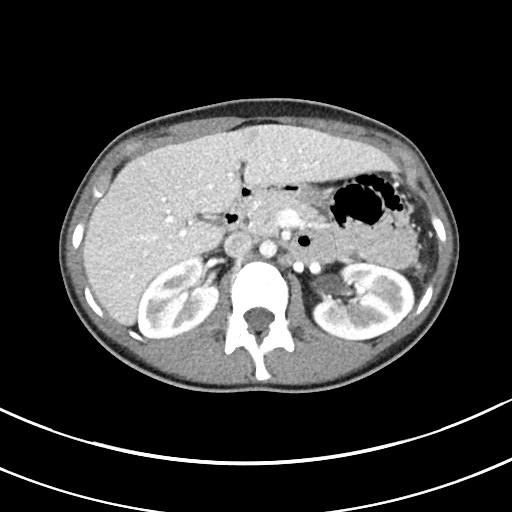
[im 63/81  soft-tissue]
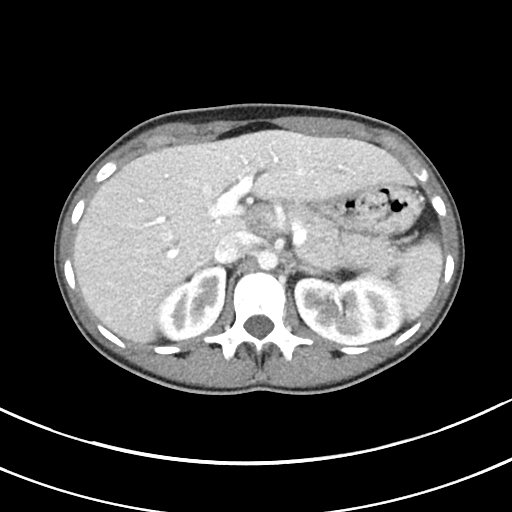
[im 72/81  soft-tissue]
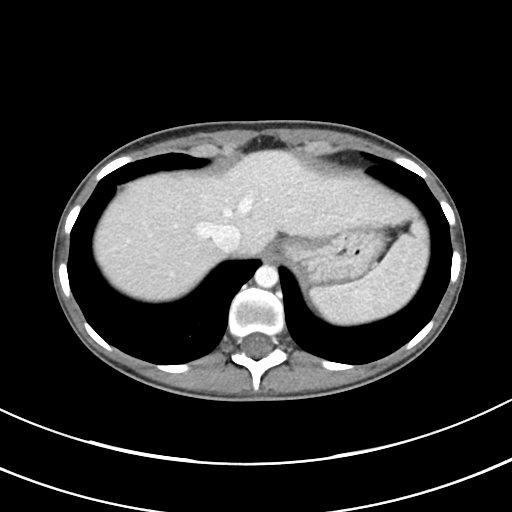
[im 76/81  soft-tissue]
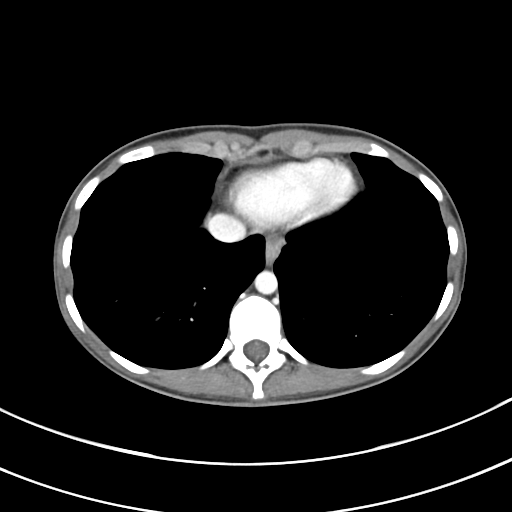

[Series 6: abdomen 3.0 mpr cor · coronal · 0.62mm/px · 3 of 64 slices shown]
[im 22/64  soft-tissue]
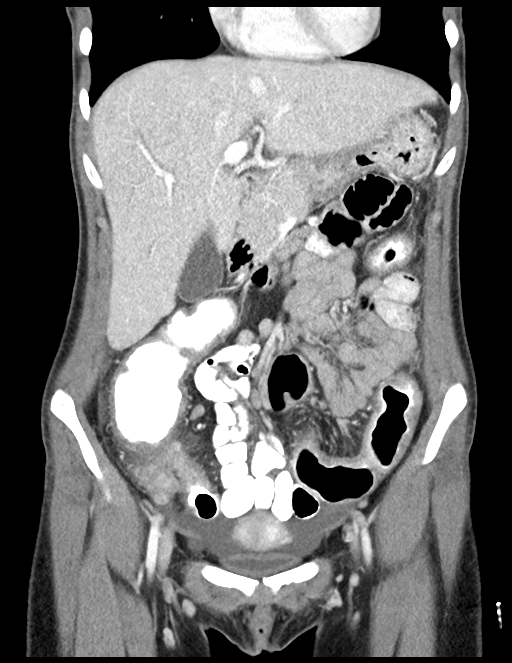
[im 29/64  soft-tissue]
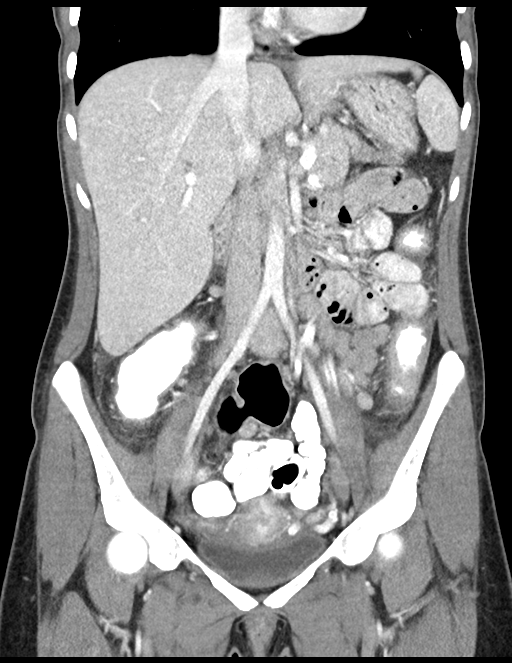
[im 36/64  soft-tissue]
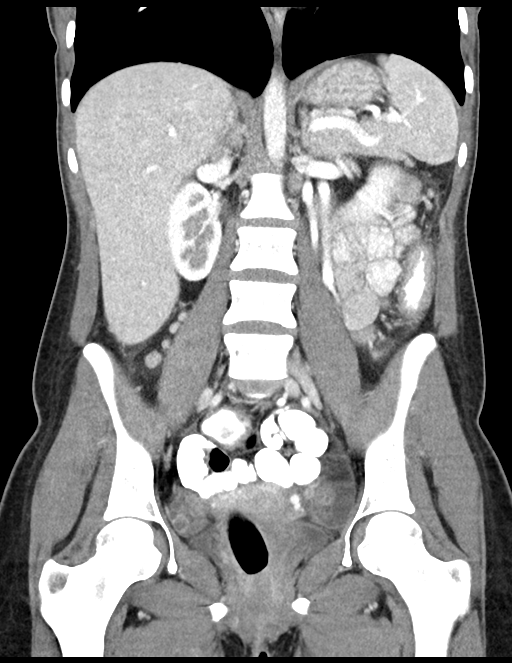

[16 of 46 positions shown; findings below may reference images not displayed]

FINDINGS: Lower chest: The lung bases are clear of acute process. No pleural
effusion or pulmonary lesions. The heart is normal in size. No
pericardial effusion. The distal esophagus and aorta are
unremarkable.

Hepatobiliary: No focal hepatic lesions or intrahepatic biliary
dilatation. The gallbladder is normal. No common bile duct
dilatation.

Pancreas: No mass, inflammation or ductal dilatation.

Spleen: Normal size.  No focal lesions.

Adrenals/Urinary Tract: Adrenal glands, kidneys and bladder are
unremarkable.

Stomach/Bowel: Diffuse colitis with moderate wall thickening and
pericolonic interstitial/inflammatory changes. There is also
involvement of the terminal ileum. The appendix also appears mildly
inflamed but I do not see any findings of obstructive appendicitis.
No obstructive findings.

Vascular/Lymphatic: The aorta is normal in caliber. No dissection.
The branch vessels are patent. The major venous structures are
patent. No mesenteric or retroperitoneal mass or adenopathy.
Scattered inflamed/ hyperplastic lymph nodes mainly in the pericecal
region.

Reproductive: The uterus and ovaries are unremarkable.

Other: No pelvic mass or adenopathy. No free pelvic fluid
collections. No inguinal mass or adenopathy. No abdominal wall
hernia or subcutaneous lesions.

Musculoskeletal: No significant bony findings.
IMPRESSION: 1. Terminal ileitis and diffuse colitis suggesting active Crohn's
disease. There is also mild diffuse inflammation of the appendix. No
obstructive findings. Associated enlarged/inflamed/hyperplastic
lymph nodes.
2. No other significant abdominal/pelvic findings.
